# Patient Record
Sex: Female | Born: 1990 | Hispanic: Yes | Marital: Single | Smoking: Never smoker
Health system: Southern US, Community
[De-identification: ages and names within clinical notes are randomized; demographics above are authoritative.]

---

## 2017-02-13 ENCOUNTER — Emergency Department (HOSPITAL_COMMUNITY): Payer: 59

## 2017-02-13 ENCOUNTER — Encounter (HOSPITAL_COMMUNITY): Payer: Self-pay

## 2017-02-13 ENCOUNTER — Emergency Department (HOSPITAL_COMMUNITY)
Admission: EM | Admit: 2017-02-13 | Discharge: 2017-02-13 | Disposition: A | Discharge status: Home / Self Care | Payer: 59 | Attending: Emergency Medicine | Admitting: Emergency Medicine

## 2017-02-13 DIAGNOSIS — R102 Pelvic and perineal pain: Secondary | ICD-10-CM

## 2017-02-13 DIAGNOSIS — R109 Unspecified abdominal pain: Secondary | ICD-10-CM | POA: Diagnosis present

## 2017-02-13 DIAGNOSIS — Z7982 Long term (current) use of aspirin: Secondary | ICD-10-CM | POA: Insufficient documentation

## 2017-02-13 LAB — CBC WITH DIFFERENTIAL/PLATELET
Basophils Absolute: 0 10*3/uL (ref 0.0–0.1)
Basophils Relative: 0 %
Eosinophils Absolute: 0.1 10*3/uL (ref 0.0–0.7)
Eosinophils Relative: 2 %
HCT: 40 % (ref 36.0–46.0)
Hemoglobin: 13.8 g/dL (ref 12.0–15.0)
Lymphocytes Relative: 44 %
Lymphs Abs: 3 10*3/uL (ref 0.7–4.0)
MCH: 29.7 pg (ref 26.0–34.0)
MCHC: 34.5 g/dL (ref 30.0–36.0)
MCV: 86 fL (ref 78.0–100.0)
Monocytes Absolute: 0.3 10*3/uL (ref 0.1–1.0)
Monocytes Relative: 5 %
Neutro Abs: 3.3 10*3/uL (ref 1.7–7.7)
Neutrophils Relative %: 49 %
Platelets: 282 10*3/uL (ref 150–400)
RBC: 4.65 MIL/uL (ref 3.87–5.11)
RDW: 12.4 % (ref 11.5–15.5)
WBC: 6.8 10*3/uL (ref 4.0–10.5)

## 2017-02-13 LAB — COMPREHENSIVE METABOLIC PANEL
ALT: 13 U/L — ABNORMAL LOW (ref 14–54)
AST: 14 U/L — ABNORMAL LOW (ref 15–41)
Albumin: 3.9 g/dL (ref 3.5–5.0)
Alkaline Phosphatase: 37 U/L — ABNORMAL LOW (ref 38–126)
Anion gap: 6 (ref 5–15)
BUN: 16 mg/dL (ref 6–20)
CO2: 26 mmol/L (ref 22–32)
Calcium: 9 mg/dL (ref 8.9–10.3)
Chloride: 106 mmol/L (ref 101–111)
Creatinine, Ser: 0.61 mg/dL (ref 0.44–1.00)
GFR calc Af Amer: >60 mL/min (ref >60)
GFR calc non Af Amer: >60 mL/min (ref >60)
Glucose, Bld: 84 mg/dL (ref 65–99)
Potassium: 3.7 mmol/L (ref 3.5–5.1)
Sodium: 138 mmol/L (ref 135–145)
Total Bilirubin: 0.4 mg/dL (ref 0.3–1.2)
Total Protein: 6.9 g/dL (ref 6.5–8.1)

## 2017-02-13 LAB — URINALYSIS, ROUTINE W REFLEX MICROSCOPIC
BILIRUBIN URINE: NEGATIVE
Glucose, UA: NEGATIVE mg/dL
Hgb urine dipstick: NEGATIVE
Ketones, ur: NEGATIVE mg/dL
Leukocytes, UA: NEGATIVE
NITRITE: NEGATIVE
PH: 6 (ref 5.0–8.0)
Protein, ur: NEGATIVE mg/dL
SPECIFIC GRAVITY, URINE: 1.019 (ref 1.005–1.030)

## 2017-02-13 LAB — POC URINE PREG, ED: Preg Test, Ur: NEGATIVE

## 2017-02-13 LAB — WET PREP, GENITAL
Clue Cells Wet Prep HPF POC: NONE SEEN
Sperm: NONE SEEN
Trich, Wet Prep: NONE SEEN
WBC, Wet Prep HPF POC: NONE SEEN
Yeast Wet Prep HPF POC: NONE SEEN

## 2017-02-13 LAB — LIPASE, BLOOD: Lipase: 22 U/L (ref 11–51)

## 2017-02-13 MED ORDER — AZITHROMYCIN 250 MG PO TABS
1000.0000 mg | ORAL_TABLET | Freq: Once | ORAL | Status: AC
  Administered 2017-02-13: 1000 mg via ORAL
  Filled 2017-02-13: qty 4

## 2017-02-13 MED ORDER — STERILE WATER FOR INJECTION IJ SOLN
INTRAMUSCULAR | Status: AC
  Administered 2017-02-13: 10 mL
  Filled 2017-02-13: qty 10

## 2017-02-13 MED ORDER — CEFTRIAXONE SODIUM 250 MG IJ SOLR
250.0000 mg | Freq: Once | INTRAMUSCULAR | Status: AC
  Administered 2017-02-13: 250 mg via INTRAMUSCULAR
  Filled 2017-02-13: qty 250

## 2017-02-13 NOTE — Discharge Instructions (Signed)
Please read attached information. If you experience any new or worsening signs or symptoms please return to the emergency room for evaluation. Please follow-up with your primary care provider or specialist as discussed.  Please continue using previously prescribed antibiotics.  Please follow up with [facility] for repeat evaluation.  If you are unable to be seen at the [facility], [facility] is another good resource for birthcontrol related questions and concerns.

## 2017-02-13 NOTE — ED Triage Notes (Signed)
Pt has had IUD x 3 years.  For past 10 days pt had had lower pelvic pain and cramping.  Vaginal discharge with odor.  Feels warm.  Some bleeding.

## 2017-02-13 NOTE — ED Provider Notes (Signed)
[facility]-EMERGENCY DEPT Provider Note   CSN: [id number] Arrival date & time: 02/13/17  1454     History   Chief Complaint Chief Complaint  Patient presents with  . Abdominal Pain    HPI Denise Carney is a 26 y.o. female.  HPI   26 year old female presents today with complaints of abdominal pain, vaginal bleeding and discharge.  Patient notes that approximately 10 days ago she started to have abdominal cramping, started to have vaginal discharge and odor over the last several days.  She reports the pain is in the suprapubic and bilateral lower pelvis region, described as cramping.  She notes darker urine.  She was seen by urgent care yesterday and diagnosed with urinary tract infection.  She was started on antibiotics, but was called today and told she needed to fill the prescription for another type of antibiotic.  She notes normally she does have her cycles at the beginning of the month.  She reports she is sexually active, does not use protection.  Patient notes IUD placed 3 years ago, no significant comp occasions with this. Pt denies fever, N/VD.    History reviewed. No pertinent past medical history.  There are no active problems to display for this patient.   History reviewed. No pertinent surgical history.  OB History    No data available      Home Medications    Prior to Admission medications   Medication Sig Start Date End Date Taking? Authorizing Provider  aspirin EC 81 MG tablet Take 81 mg by mouth as needed (pain).   Yes Historical Provider, MD  cefUROXime (CEFTIN) 250 MG tablet Take 250 mg by mouth 2 (two) times daily with a meal.   Yes Historical Provider, MD  ibuprofen (ADVIL,MOTRIN) 200 MG tablet Take 400 mg by mouth every 6 (six) hours as needed.   Yes Historical Provider, MD  levonorgestrel (MIRENA) 20 MCG/24HR IUD 1 each by Intrauterine route once.   Yes Historical Provider, MD  nitrofurantoin, macrocrystal-monohydrate, (MACROBID) 100 MG capsule Take 100 mg  by mouth 2 (two) times daily.   Yes Historical Provider, MD    Family History History reviewed. No pertinent family history.  Social History Social History  Substance Use Topics  . Smoking status: Never Smoker  . Smokeless tobacco: Never Used  . Alcohol use Yes     Comment: social     Allergies   Patient has no known allergies.   Review of Systems Review of Systems  All other systems reviewed and are negative.   Physical Exam Updated Vital Signs BP 105/68 (BP Location: Left Arm)   Pulse 77   Temp 98 F (36.7 C) (Oral)   Resp 12   SpO2 100%   Physical Exam  Constitutional: She is oriented to person, place, and time. She appears well-developed and well-nourished.  HENT:  Head: Normocephalic and atraumatic.  Eyes: Conjunctivae are normal. Pupils are equal, round, and reactive to light. Right eye exhibits no discharge. Left eye exhibits no discharge. No scleral icterus.  Neck: Normal range of motion. No JVD present. No tracheal deviation present.  Pulmonary/Chest: Effort normal. No stridor.  Abdominal: Soft. She exhibits no distension.  Minor TTP to palpation of bilateral pelvis and suprapubic region; remainder of exam benign   Genitourinary:  Genitourinary Comments: No significant vaginal discharge, no cervical motion tenderness, no adnexal masses.  No vaginal bleeding  Neurological: She is alert and oriented to person, place, and time. Coordination normal.  Psychiatric: She has a normal  mood and affect. Her behavior is normal. Judgment and thought content normal.  Nursing note and vitals reviewed.   ED Treatments / Results  Labs (all labs ordered are listed, but only abnormal results are displayed) Labs Reviewed  COMPREHENSIVE METABOLIC PANEL - Abnormal; Notable for the following:       Result Value   AST 14 (*)    ALT 13 (*)    Alkaline Phosphatase 37 (*)    All other components within normal limits  WET PREP, GENITAL  URINALYSIS, ROUTINE W REFLEX  MICROSCOPIC  CBC WITH DIFFERENTIAL/PLATELET  LIPASE, BLOOD  POC URINE PREG, ED  GC/CHLAMYDIA PROBE AMP () NOT AT [facility]    EKG  EKG Interpretation None       Radiology US Transvaginal Non-ob  Result Date: 02/13/2017 CLINICAL DATA:  Pelvic pain and cramping with vaginal spotting. EXAM: TRANSABDOMINAL AND TRANSVAGINAL ULTRASOUND OF PELVIS DOPPLER ULTRASOUND OF OVARIES TECHNIQUE: Both transabdominal and transvaginal ultrasound examinations of the pelvis were performed. Transabdominal technique was performed for global imaging of the pelvis including uterus, ovaries, adnexal regions, and pelvic cul-de-sac. It was necessary to proceed with endovaginal exam following the transabdominal exam to visualize the ovaries. Color and duplex Doppler ultrasound was utilized to evaluate blood flow to the ovaries. COMPARISON:  None. FINDINGS: Uterus Measurements: 7.0 x 3.4 x 4.9 cm. No fibroids or other mass visualized. IUD visualized in the endometrial canal. Endometrium Thickness: 12 mm.  No focal abnormality visualized. Right ovary Measurements: 3.0 x 1.9 x 2.7 cm. Normal appearance/no adnexal mass. Left ovary Measurements: 3.2 x 2.2 x 2.9 cm. Normal appearance/no adnexal mass. Pulsed Doppler evaluation of both ovaries demonstrates normal low-resistance arterial and venous waveforms. Other findings No abnormal free fluid. IMPRESSION: Normal pelvic ultrasound. Electronically Signed   By: Kennith Center M.D.   On: 02/13/2017 20:01   US Pelvis Complete  Result Date: 02/13/2017 CLINICAL DATA:  Pelvic pain and cramping with vaginal spotting. EXAM: TRANSABDOMINAL AND TRANSVAGINAL ULTRASOUND OF PELVIS DOPPLER ULTRASOUND OF OVARIES TECHNIQUE: Both transabdominal and transvaginal ultrasound examinations of the pelvis were performed. Transabdominal technique was performed for global imaging of the pelvis including uterus, ovaries, adnexal regions, and pelvic cul-de-sac. It was necessary to proceed with endovaginal  exam following the transabdominal exam to visualize the ovaries. Color and duplex Doppler ultrasound was utilized to evaluate blood flow to the ovaries. COMPARISON:  None. FINDINGS: Uterus Measurements: 7.0 x 3.4 x 4.9 cm. No fibroids or other mass visualized. IUD visualized in the endometrial canal. Endometrium Thickness: 12 mm.  No focal abnormality visualized. Right ovary Measurements: 3.0 x 1.9 x 2.7 cm. Normal appearance/no adnexal mass. Left ovary Measurements: 3.2 x 2.2 x 2.9 cm. Normal appearance/no adnexal mass. Pulsed Doppler evaluation of both ovaries demonstrates normal low-resistance arterial and venous waveforms. Other findings No abnormal free fluid. IMPRESSION: Normal pelvic ultrasound. Electronically Signed   By: Kennith Center M.D.   On: 02/13/2017 20:01   [place] Art/ven Flow Abd Pelv Doppler  Result Date: 02/13/2017 CLINICAL DATA:  Pelvic pain and cramping with vaginal spotting. EXAM: TRANSABDOMINAL AND TRANSVAGINAL ULTRASOUND OF PELVIS DOPPLER ULTRASOUND OF OVARIES TECHNIQUE: Both transabdominal and transvaginal ultrasound examinations of the pelvis were performed. Transabdominal technique was performed for global imaging of the pelvis including uterus, ovaries, adnexal regions, and pelvic cul-de-sac. It was necessary to proceed with endovaginal exam following the transabdominal exam to visualize the ovaries. Color and duplex Doppler ultrasound was utilized to evaluate blood flow to the ovaries. COMPARISON:  None. FINDINGS: Uterus  Measurements: 7.0 x 3.4 x 4.9 cm. No fibroids or other mass visualized. IUD visualized in the endometrial canal. Endometrium Thickness: 12 mm.  No focal abnormality visualized. Right ovary Measurements: 3.0 x 1.9 x 2.7 cm. Normal appearance/no adnexal mass. Left ovary Measurements: 3.2 x 2.2 x 2.9 cm. Normal appearance/no adnexal mass. Pulsed Doppler evaluation of both ovaries demonstrates normal low-resistance arterial and venous waveforms. Other findings No abnormal  free fluid. IMPRESSION: Normal pelvic ultrasound. Electronically Signed   By: Kennith Center M.D.   On: 02/13/2017 20:01    Procedures Procedures (including critical care time)  Medications Ordered in ED Medications  azithromycin (ZITHROMAX) tablet 1,000 mg (1,000 mg Oral Given 02/13/17 1918)  cefTRIAXone (ROCEPHIN) injection 250 mg (250 mg Intramuscular Given 02/13/17 1918)  sterile water (preservative free) injection (10 mLs  Given 02/13/17 1918)     Initial Impression / Assessment and Plan / ED Course  I have reviewed the triage vital signs and the nursing notes.  Pertinent labs & imaging results that were available during my care of the patient were reviewed by me and considered in my medical decision making (see chart for details).     Final Clinical Impressions(s) / ED Diagnoses   Final diagnoses:  Pelvic pain    Labs: Point-of-care urine pregnancy, urinalysis, wet prep, GC  Imaging: Ultrasound pelvis complete  Consults:  Therapeutics: Azithromycin, ceftriaxone  Discharge Meds:   Assessment/Plan: 26 year old female presents today with pelvic pain.  Patient is afebrile and nontoxic.  She is on antibiotics that were prescribed for urinary tract infection.  From what sounds like she was placed in one antibiotic, called after culture results returned showing low sensitivity for previously prescribed antibiotics.  Today her urinalysis is clean, she has no significant discharge, no concerning findings on wet prep, completely normal CBC, CMP, lipase, and normal ultrasound.  Her pelvic exam shows no signs of PID.  Patient is requesting prophylactic treatment for STDs as her partner is having "itching".  Patient will be given ceftriaxone and azithromycin.  [facility] continue to persist, instructed to return to emergency room if they worsen.  She verbalized understanding and agreement to this plan and had no further questions or concerns from  discharge.      New Prescriptions New Prescriptions   No medications on file     Eyvonne Mechanic, PA-C 02/13/17 2027    Mancel Bale, MD 02/14/17 [phone]

## 2017-02-14 LAB — GC/CHLAMYDIA PROBE AMP (~~LOC~~) NOT AT ARMC
Chlamydia: NEGATIVE
Neisseria Gonorrhea: NEGATIVE

## 2018-05-15 IMAGING — US US TRANSVAGINAL NON-OB
1 series · 14 of 25 positions shown · non-contrast
Comparison: None.

CLINICAL DATA: Pelvic pain and cramping with vaginal spotting.

EXAM:
TRANSABDOMINAL AND TRANSVAGINAL ULTRASOUND OF PELVIS
DOPPLER ULTRASOUND OF OVARIES
TECHNIQUE: Both transabdominal and transvaginal ultrasound examinations of the
pelvis were performed. Transabdominal technique was performed for
global imaging of the pelvis including uterus, ovaries, adnexal
regions, and pelvic cul-de-sac.
It was necessary to proceed with endovaginal exam following the
transabdominal exam to visualize the ovaries. Color and duplex
Doppler ultrasound was utilized to evaluate blood flow to the
ovaries.

[Series 1: us transvaginal non-ob · 0.18mm/px · 85 acquisitions, 14 frames shown]
[im 1/85]
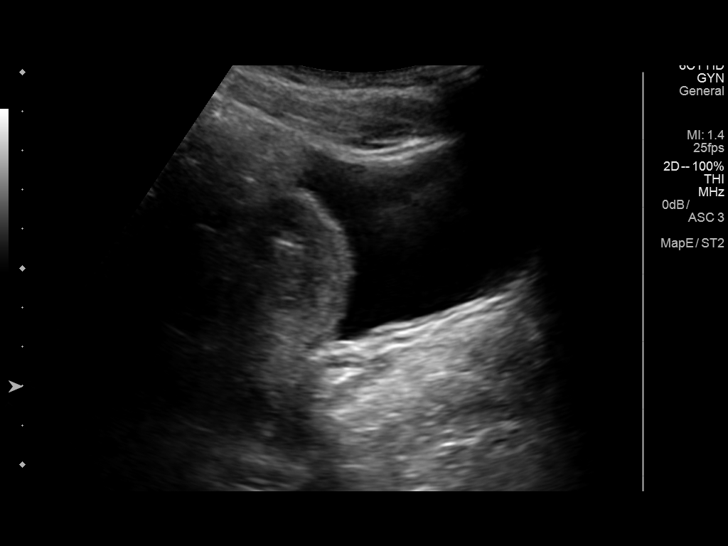
[im 8/85]
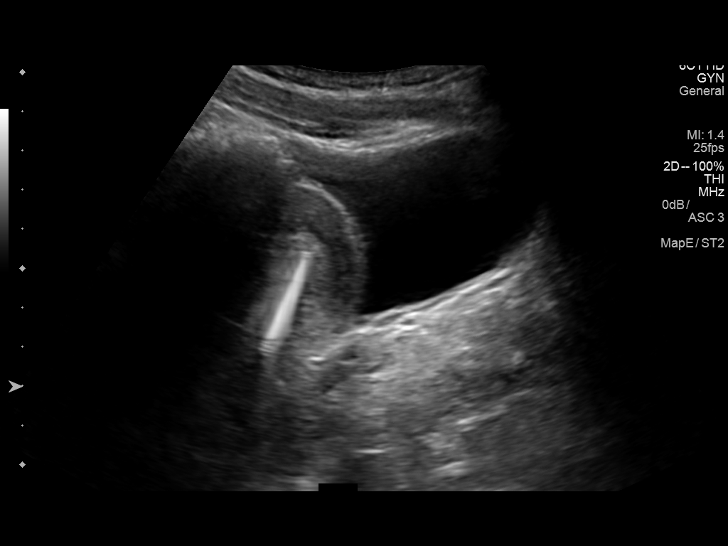
[im 15/85]
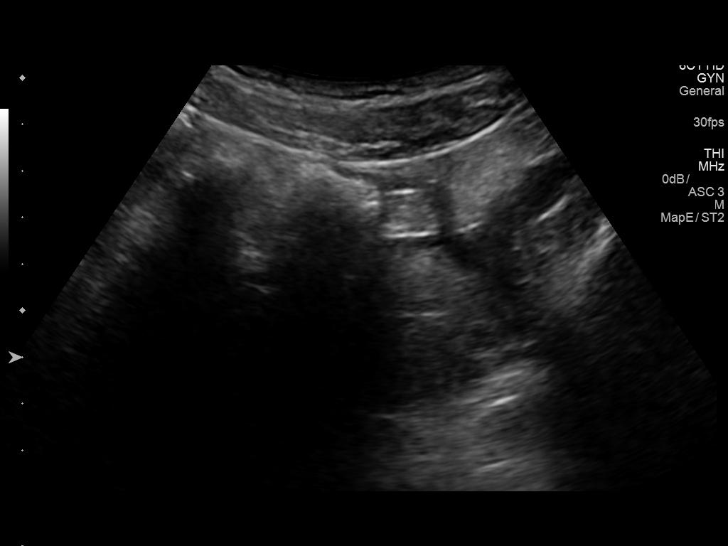
[im 22/85]
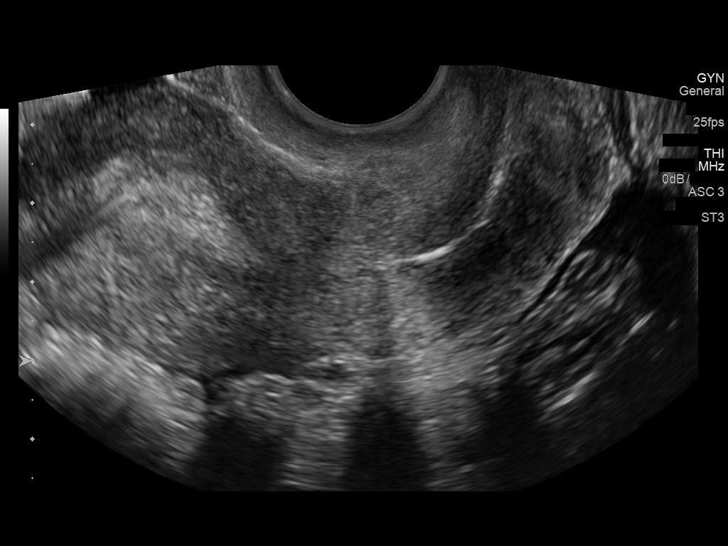
[im 29/85]
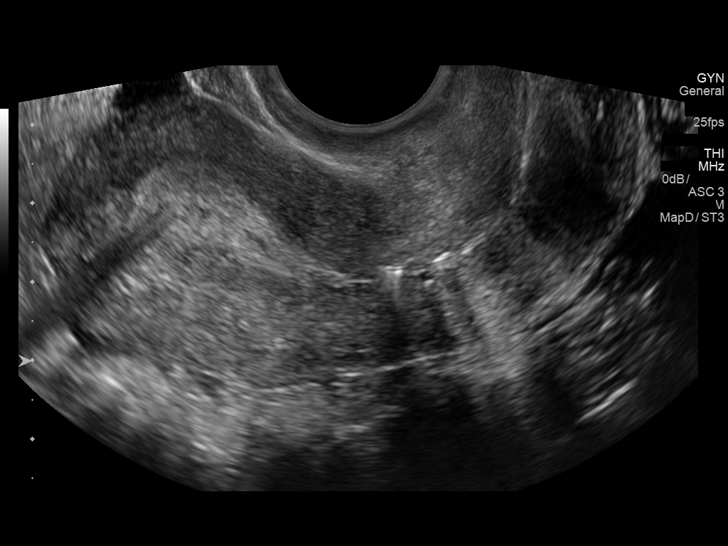
[im 32/85]
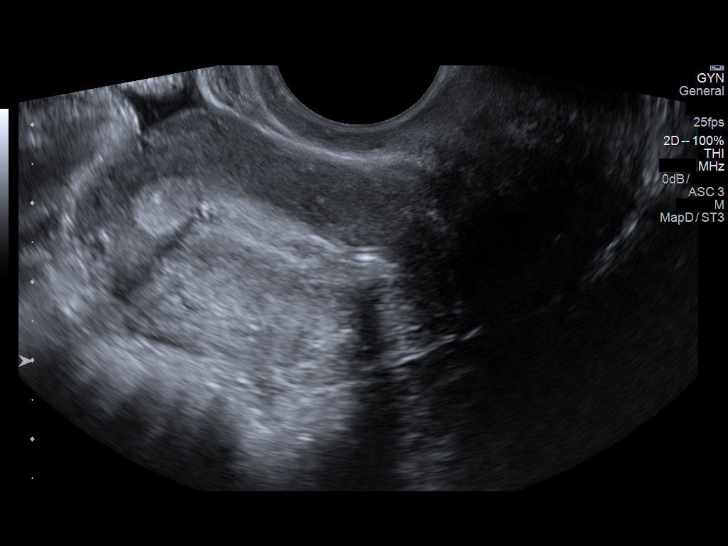
[im 39/85]
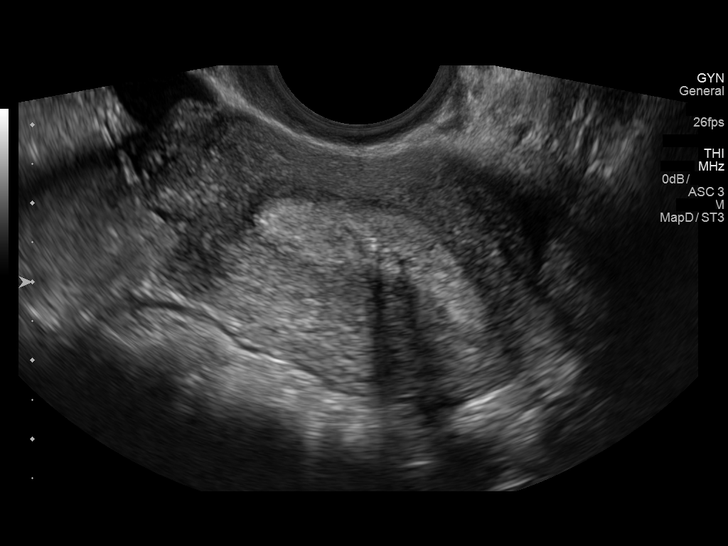
[im 46/85]
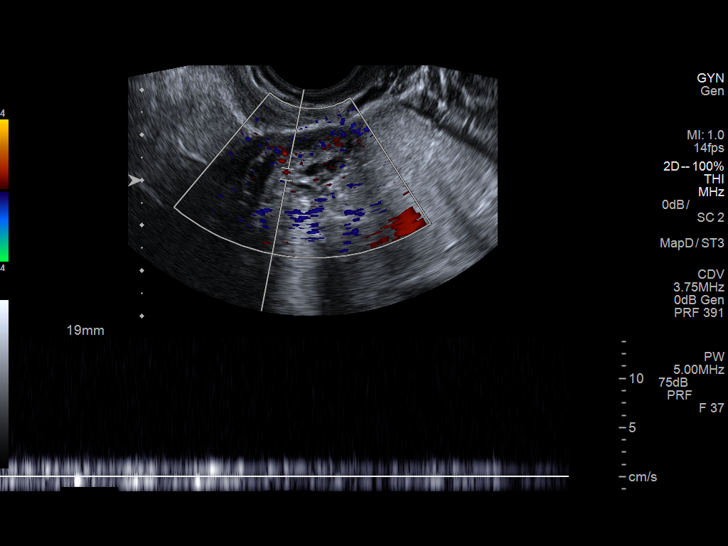
[im 53/85]
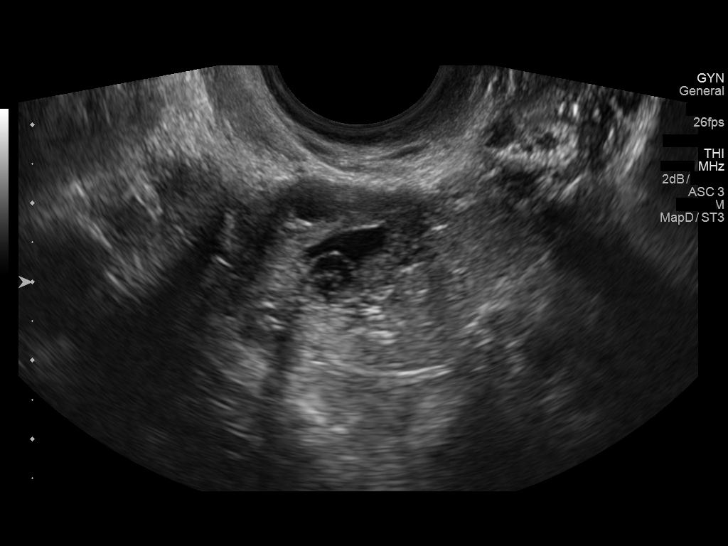
[im 57/85]
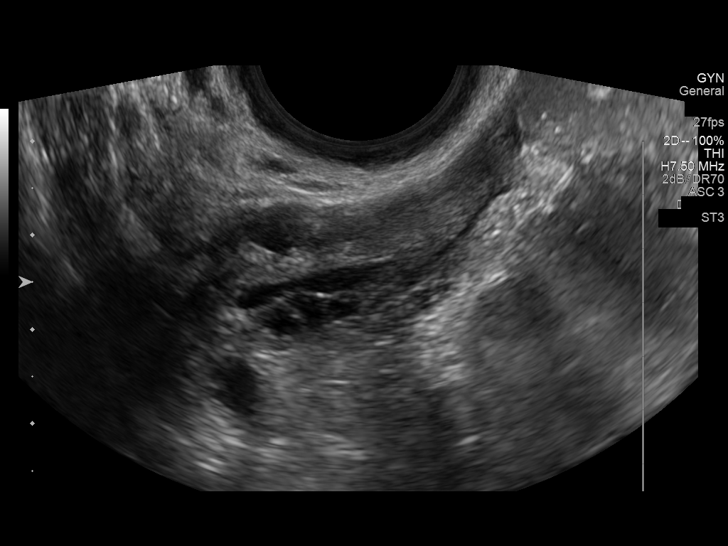
[im 64/85]
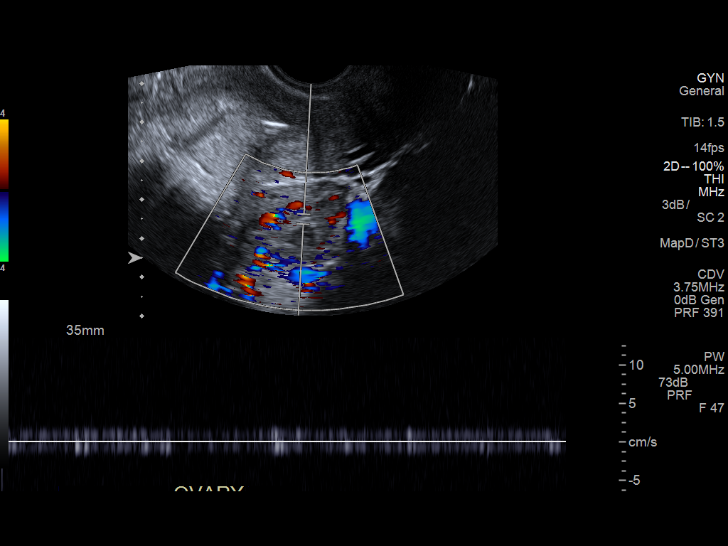
[im 71/85]
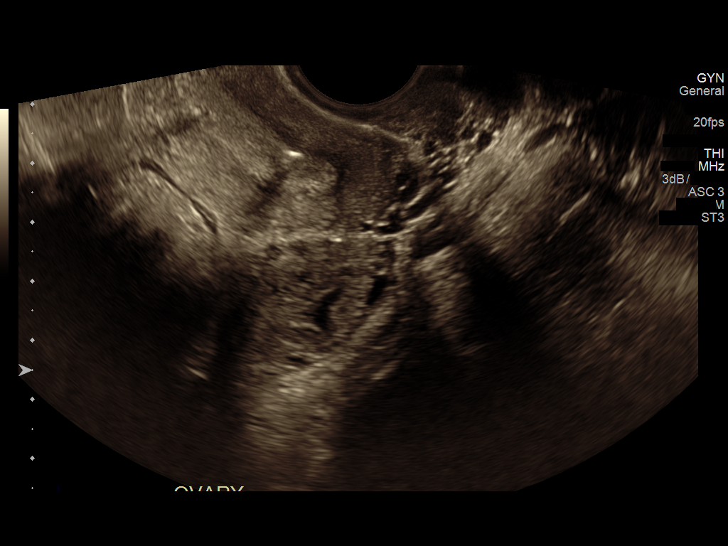
[im 78/85]
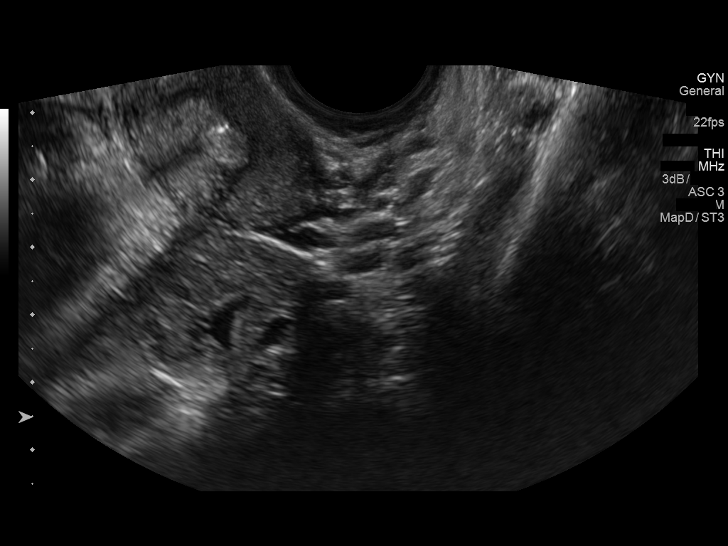
[im 85/85]
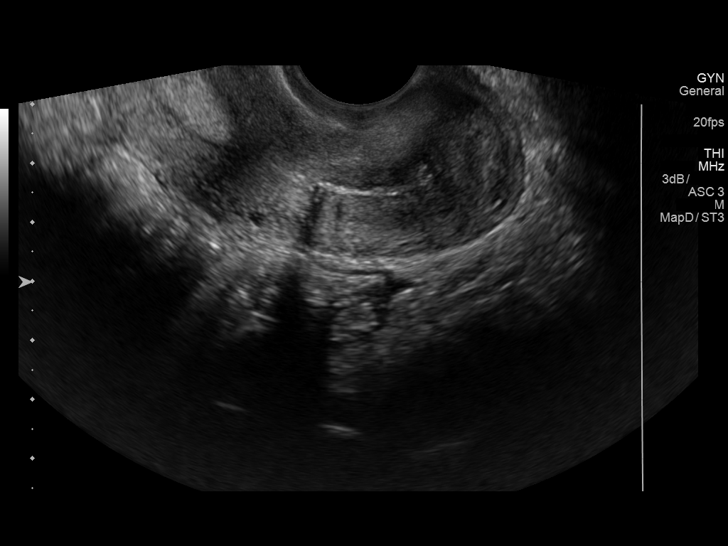

[14 of 25 positions shown; findings below may reference images not displayed]

FINDINGS: Uterus

Measurements: 7.0 x 3.4 x 4.9 cm. No fibroids or other mass
visualized. IUD visualized in the endometrial canal.

Endometrium

Thickness: 12 mm.  No focal abnormality visualized.

Right ovary

Measurements: 3.0 x 1.9 x 2.7 cm. Normal appearance/no adnexal mass.

Left ovary

Measurements: 3.2 x 2.2 x 2.9 cm. Normal appearance/no adnexal mass.

Pulsed Doppler evaluation of both ovaries demonstrates normal
low-resistance arterial and venous waveforms.

Other findings

No abnormal free fluid.
IMPRESSION: Normal pelvic ultrasound.
